# Patient Record
Sex: Female | Born: 2000
Health system: Southern US, Community
[De-identification: ages and names within clinical notes are randomized; demographics above are authoritative.]

## PROBLEM LIST (undated history)

## (undated) DIAGNOSIS — F329 Major depressive disorder, single episode, unspecified: Secondary | ICD-10-CM

## (undated) DIAGNOSIS — F32A Depression, unspecified: Secondary | ICD-10-CM

## (undated) DIAGNOSIS — F419 Anxiety disorder, unspecified: Secondary | ICD-10-CM

## (undated) HISTORY — DX: Anxiety disorder, unspecified: F41.9

## (undated) HISTORY — DX: Depression, unspecified: F32.A

---
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
## 2020-03-24 DIAGNOSIS — B354 Tinea corporis: Secondary | ICD-10-CM | POA: Diagnosis not present

## 2020-07-16 DIAGNOSIS — Z0000 Encounter for general adult medical examination without abnormal findings: Secondary | ICD-10-CM | POA: Diagnosis not present

## 2020-08-19 DIAGNOSIS — J0100 Acute maxillary sinusitis, unspecified: Secondary | ICD-10-CM | POA: Diagnosis not present

## 2020-08-19 DIAGNOSIS — J039 Acute tonsillitis, unspecified: Secondary | ICD-10-CM | POA: Diagnosis not present

## 2020-09-29 DIAGNOSIS — J039 Acute tonsillitis, unspecified: Secondary | ICD-10-CM | POA: Diagnosis not present

## 2020-09-30 DIAGNOSIS — J039 Acute tonsillitis, unspecified: Secondary | ICD-10-CM | POA: Diagnosis not present

## 2020-10-08 DIAGNOSIS — T7421XA Adult sexual abuse, confirmed, initial encounter: Secondary | ICD-10-CM | POA: Diagnosis not present

## 2020-10-08 DIAGNOSIS — Z113 Encounter for screening for infections with a predominantly sexual mode of transmission: Secondary | ICD-10-CM | POA: Diagnosis not present

## 2020-11-13 DIAGNOSIS — T7421XD Adult sexual abuse, confirmed, subsequent encounter: Secondary | ICD-10-CM | POA: Diagnosis not present

## 2020-11-13 DIAGNOSIS — Z1159 Encounter for screening for other viral diseases: Secondary | ICD-10-CM | POA: Diagnosis not present

## 2020-11-13 DIAGNOSIS — Z114 Encounter for screening for human immunodeficiency virus [HIV]: Secondary | ICD-10-CM | POA: Diagnosis not present

## 2020-11-13 DIAGNOSIS — Z1321 Encounter for screening for nutritional disorder: Secondary | ICD-10-CM | POA: Diagnosis not present

## 2020-11-13 DIAGNOSIS — Z1329 Encounter for screening for other suspected endocrine disorder: Secondary | ICD-10-CM | POA: Diagnosis not present

## 2020-11-13 DIAGNOSIS — Z01411 Encounter for gynecological examination (general) (routine) with abnormal findings: Secondary | ICD-10-CM | POA: Diagnosis not present

## 2020-11-13 DIAGNOSIS — A749 Chlamydial infection, unspecified: Secondary | ICD-10-CM | POA: Diagnosis not present

## 2020-11-13 DIAGNOSIS — Z01419 Encounter for gynecological examination (general) (routine) without abnormal findings: Secondary | ICD-10-CM | POA: Diagnosis not present

## 2020-11-13 DIAGNOSIS — T7421XA Adult sexual abuse, confirmed, initial encounter: Secondary | ICD-10-CM | POA: Diagnosis not present

## 2020-11-13 DIAGNOSIS — Z113 Encounter for screening for infections with a predominantly sexual mode of transmission: Secondary | ICD-10-CM | POA: Diagnosis not present

## 2020-11-29 IMAGING — MR MRI OF THE LEFT HUMERUS WITHOUT AND WITH CONTRAST
4 of 9 series · 17 of 40 positions shown · IV contrast (multihance)
Comparison: None.

CLINICAL DATA: Two palpable lumps felt overlying the left humerus.

EXAM:
MRI OF THE LEFT HUMERUS WITHOUT AND WITH CONTRAST
TECHNIQUE: Multiplanar, multisequence MR imaging of the left was performed
before and after the administration of intravenous contrast.
CONTRAST:  15mL MULTIHANCE GADOBENATE DIMEGLUMINE 529 MG/ML IV SOLN

[Series 3: T2 fat-sat · axial · 6.0mm · 0.31mm/px · z∈[-113,+104]mm · 6 of 30 slices shown]
[im 1/30]
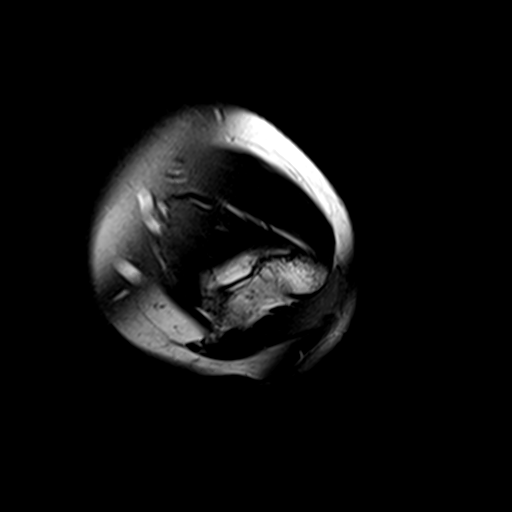
[im 6/30]
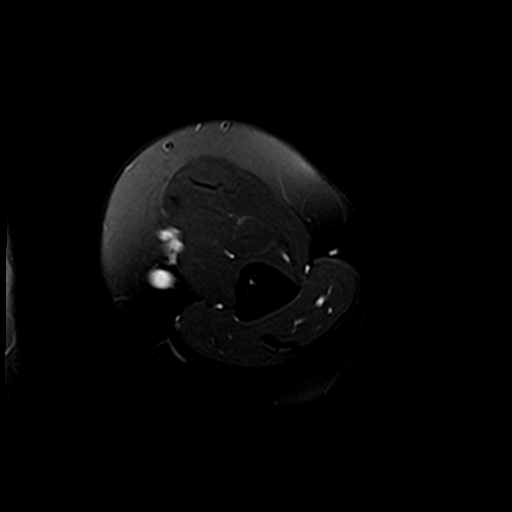
[im 12/30]
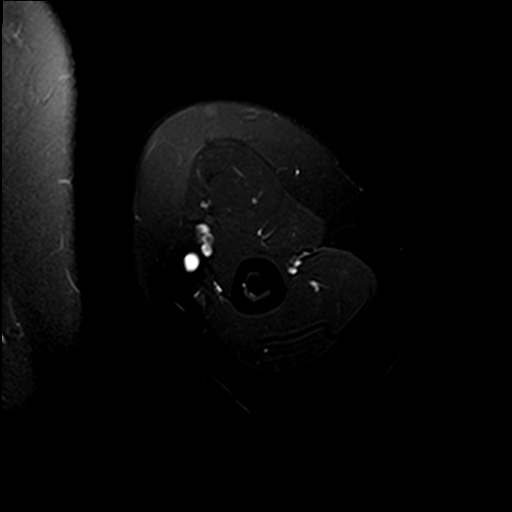
[im 18/30]
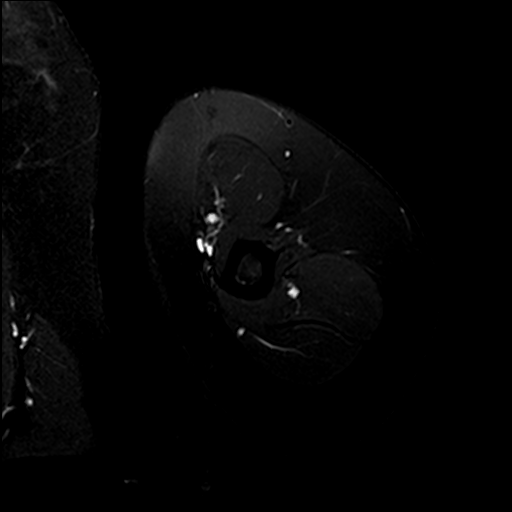
[im 24/30]
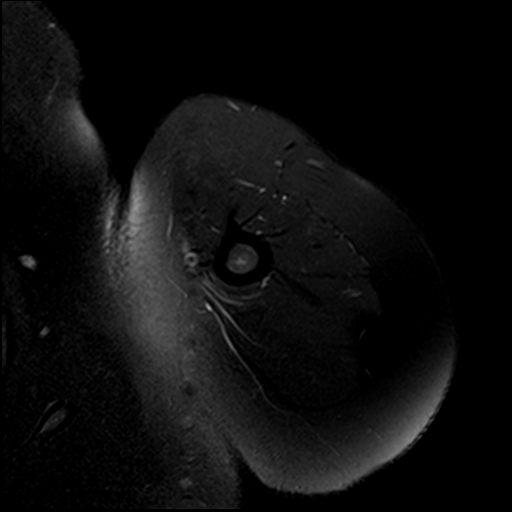
[im 30/30]
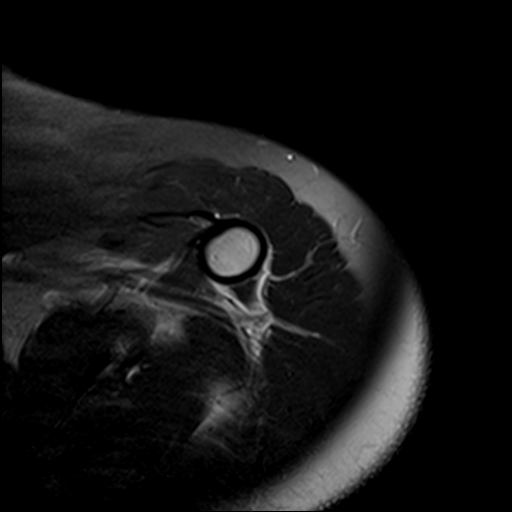

[Series 4: T1 · axial · 6.0mm · 0.31mm/px · z∈[-113,+104]mm · 5 of 30 slices shown (1 of 3)]
[im 1/30]
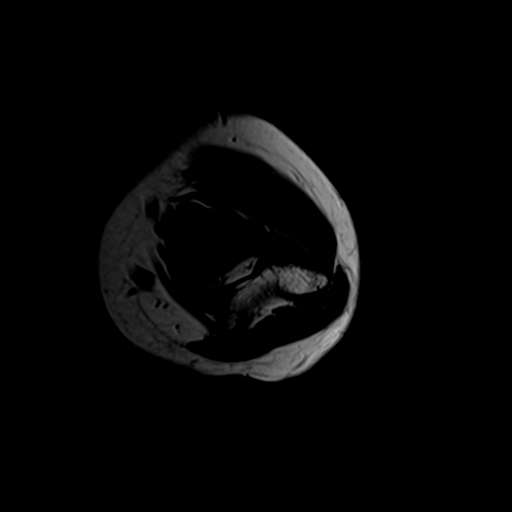
[im 6/30]
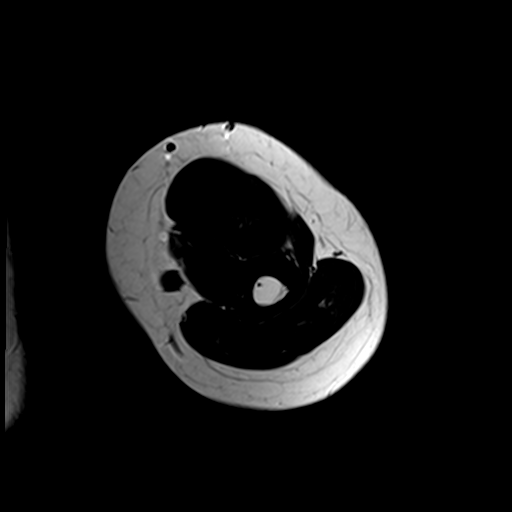
[im 12/30]
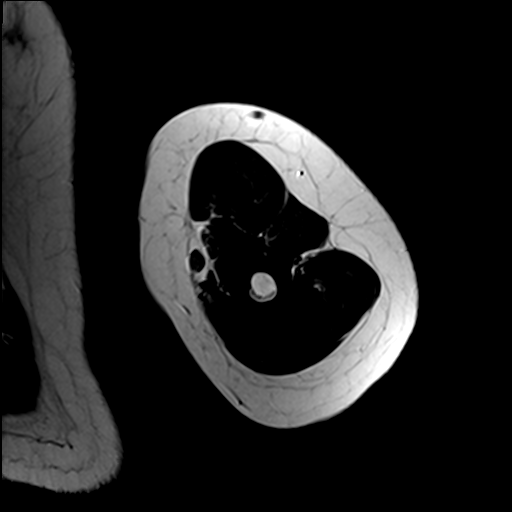
[im 18/30]
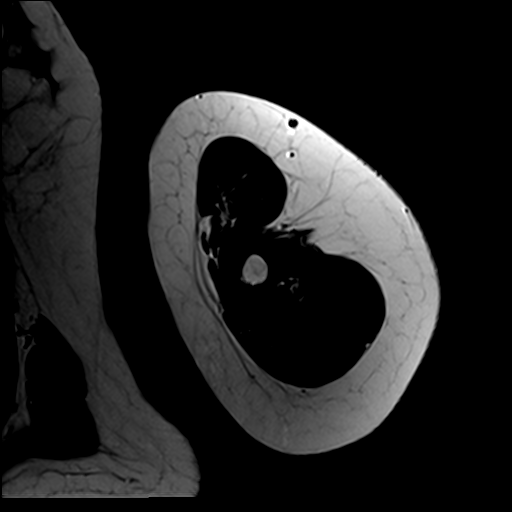
[im 30/30]
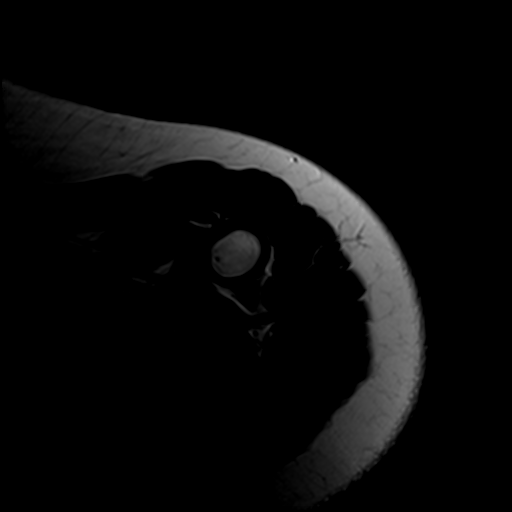

[Series 5: T1 · coronal · 4.5mm · 0.44mm/px · 3 of 22 slices shown (2 of 3)]
[im 1/22]
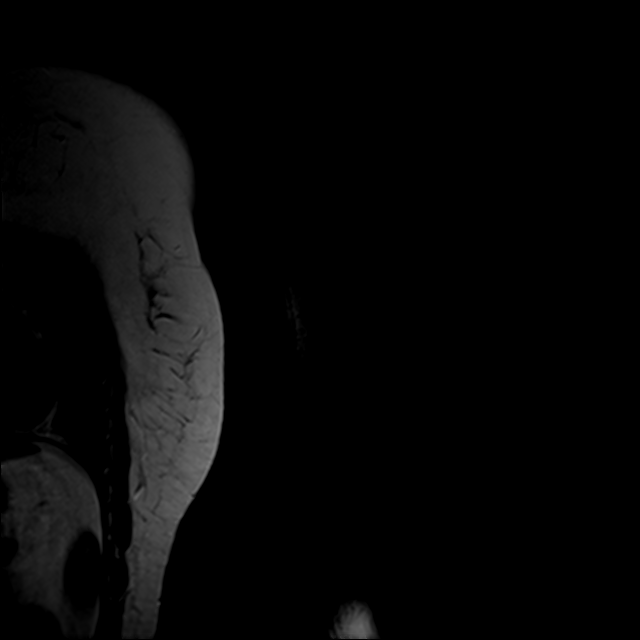
[im 15/22]
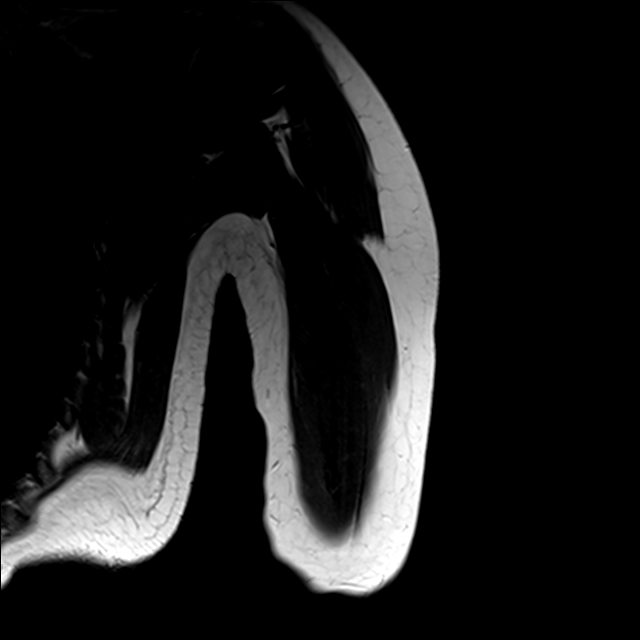
[im 22/22]
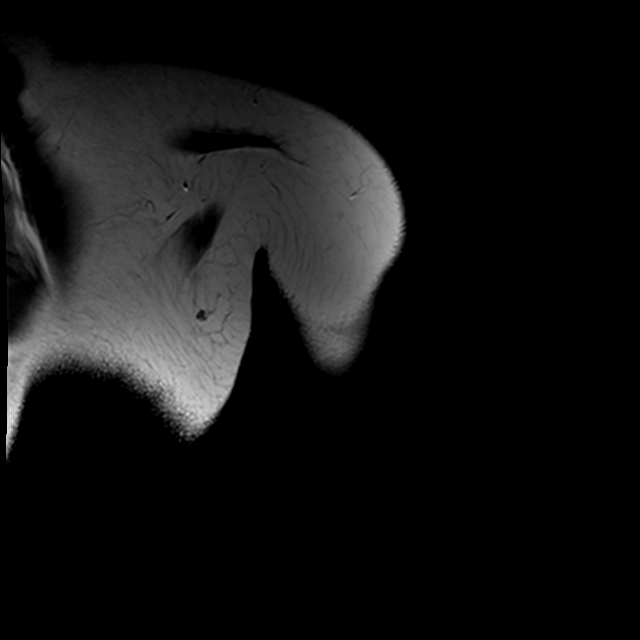

[Series 7: T1 · sagittal · 4.0mm · 0.44mm/px · 3 of 22 slices shown (3 of 3)]
[im 1/22]
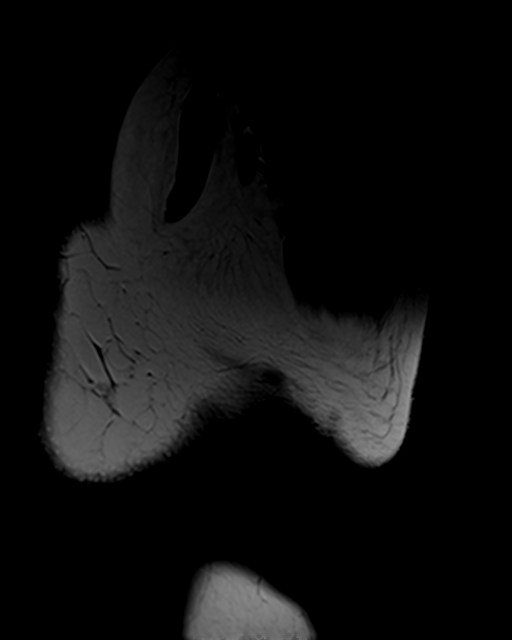
[im 15/22]
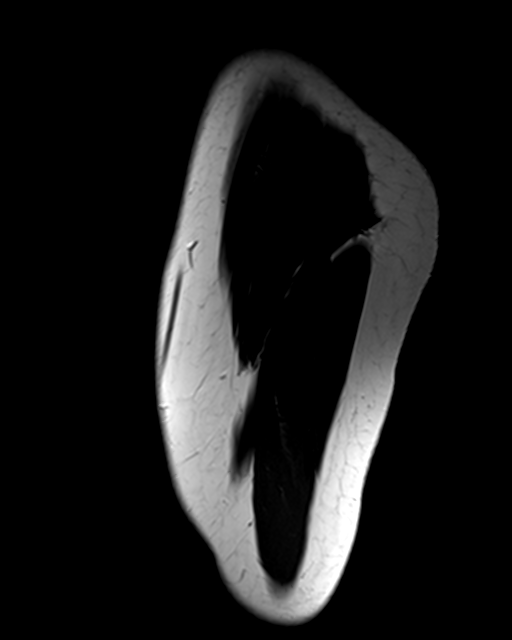
[im 22/22]
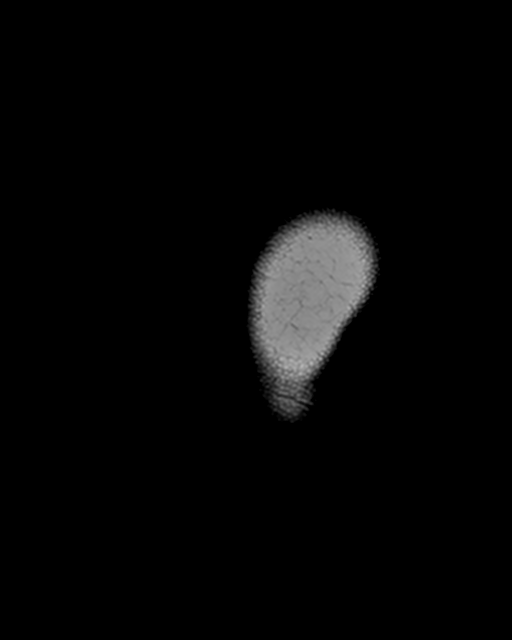

[17 of 40 positions shown; findings below may reference images not displayed]

FINDINGS: There are two external skin markers placed along the anterolateral
aspect of the left upper arm at the site of patient's reported
palpable abnormality. The more superior of which is at the level of
the proximal humeral diaphysis. The more distal marker is at the
level of the distal humeral metadiaphysis.

Bones/Joint/Cartilage

No acute fracture. No malalignment. No abnormal bone marrow signal.
No focal bone lesion. Limited view of the left shoulder appears
unremarkable. No joint effusion.

Ligaments

Grossly intact.

Muscles and Tendons

Muscle is normal in bulk and signal intensity without atrophy, fatty
infiltration, or edema. The included tendons are intact without
abnormality.

Soft tissues

Normal soft tissues without solid or cystic mass. No abnormal
enhancement on postcontrast sequences.
IMPRESSION: Unremarkable pre-and post-contrast MRI of the left humerus. No solid
or cystic soft tissue masses or areas of abnormal enhancement.
